# Patient Record
Sex: Male | Born: 2000 | Race: White | Hispanic: No | Marital: Single | Smoking: Never smoker
Health system: Southern US, Community
[De-identification: ages and names within clinical notes are randomized; demographics above are authoritative.]

## PROBLEM LIST (undated history)

## (undated) DIAGNOSIS — I10 Essential (primary) hypertension: Secondary | ICD-10-CM

## (undated) DIAGNOSIS — J45909 Unspecified asthma, uncomplicated: Secondary | ICD-10-CM

## (undated) HISTORY — PX: WISDOM TOOTH EXTRACTION: SHX21

## (undated) HISTORY — DX: Unspecified asthma, uncomplicated: J45.909

---
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
## 2018-04-13 ENCOUNTER — Other Ambulatory Visit: Payer: Self-pay | Admitting: Family

## 2018-04-13 DIAGNOSIS — H00011 Hordeolum externum right upper eyelid: Secondary | ICD-10-CM

## 2018-04-13 DIAGNOSIS — H00015 Hordeolum externum left lower eyelid: Secondary | ICD-10-CM

## 2019-08-30 IMAGING — DX DG KNEE 1-2V*R*
2 series · 2 of 2 positions shown · non-contrast
Comparison: None.

CLINICAL DATA: Injured right knee playing basketball. Unable to
straighten.

EXAM:
RIGHT KNEE - 1-2 VIEW

[knee ap]
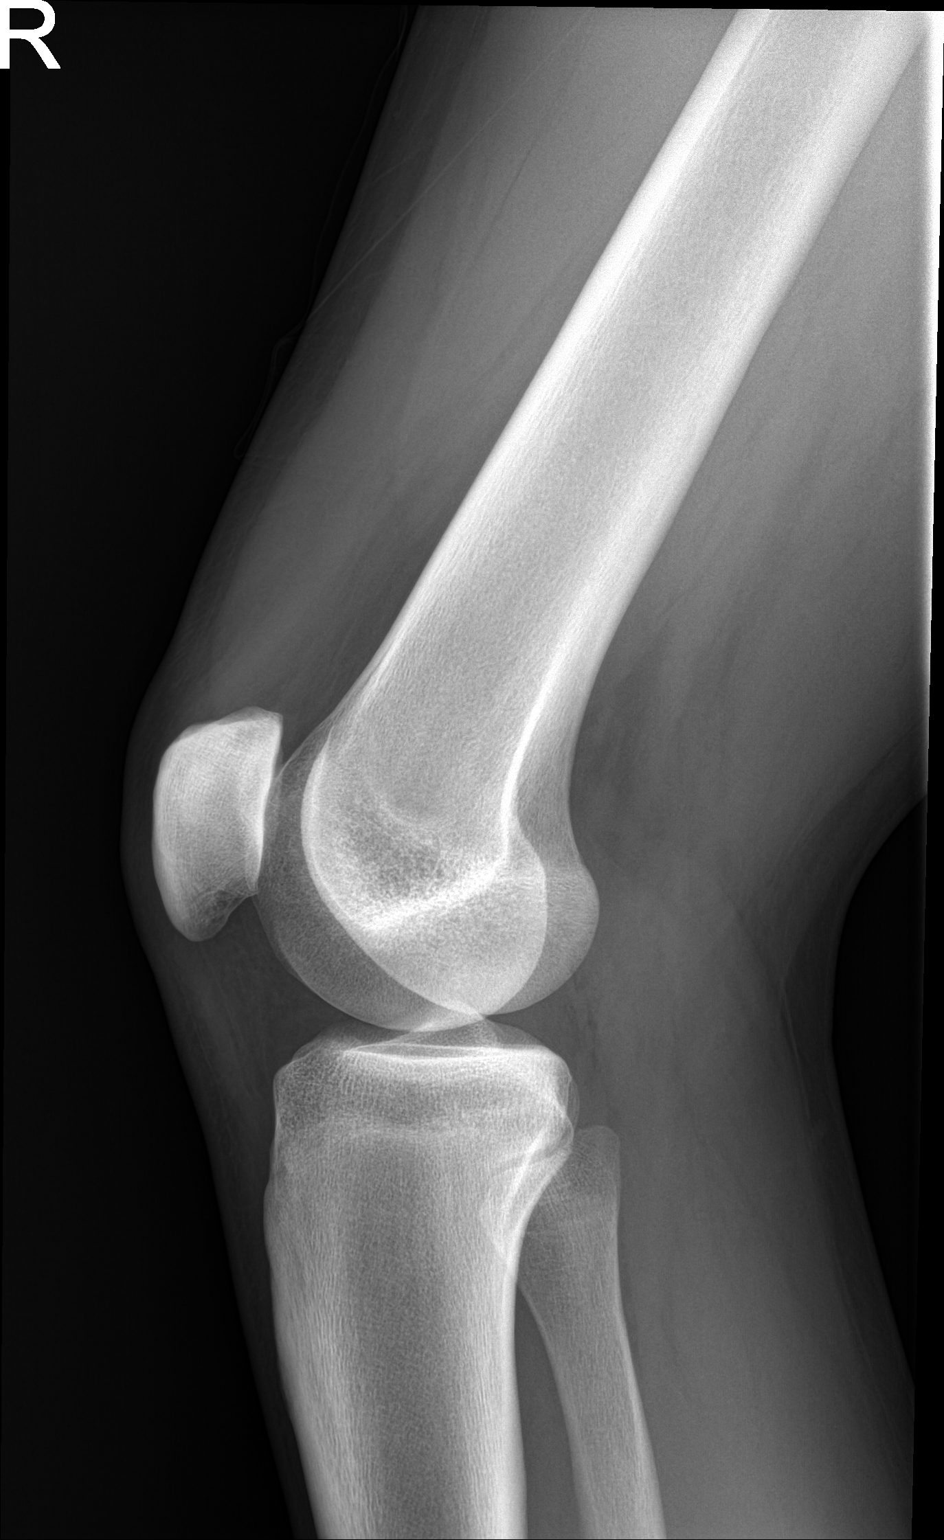

[knee lat]
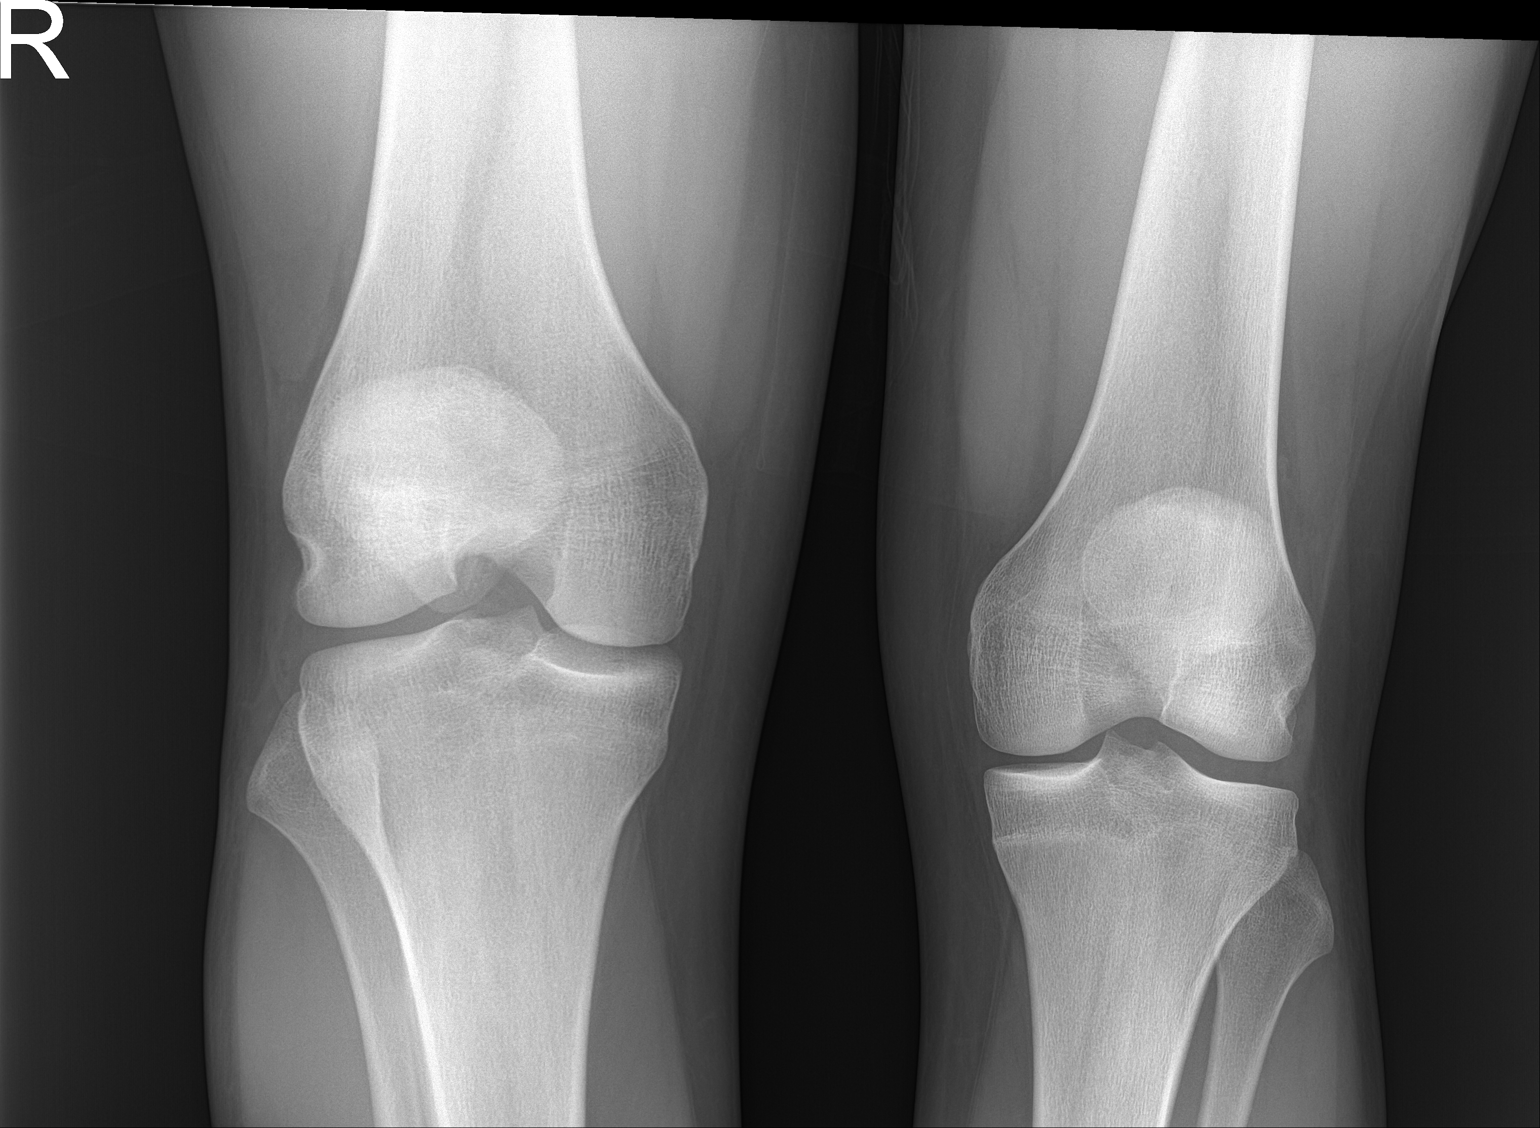

[2 of 2 positions shown; findings below may reference images not displayed]

FINDINGS: No evidence of fracture, dislocation, or joint effusion. No evidence
of arthropathy or other focal bone abnormality. Soft tissues are
unremarkable.
IMPRESSION: No acute osseous injury of the right knee.

## 2020-02-26 ENCOUNTER — Ambulatory Visit (INDEPENDENT_AMBULATORY_CARE_PROVIDER_SITE_OTHER): Payer: Medicaid Other | Admitting: Nurse Practitioner

## 2020-02-26 ENCOUNTER — Encounter: Payer: Self-pay | Admitting: Nurse Practitioner

## 2020-02-26 ENCOUNTER — Other Ambulatory Visit: Payer: Self-pay

## 2020-02-26 VITALS — BP 139/74 | HR 78 | Temp 98.1°F | Resp 20 | Ht 72.0 in | Wt 241.0 lb

## 2020-02-26 DIAGNOSIS — F419 Anxiety disorder, unspecified: Secondary | ICD-10-CM | POA: Insufficient documentation

## 2020-02-26 MED ORDER — BUPROPION HCL ER (SR) 100 MG PO TB12: 100.0000 mg | ORAL_TABLET | Freq: Two times a day (BID) | ORAL | 0 refills | Status: AC

## 2020-02-26 NOTE — Patient Instructions (Addendum)
Started you on BuSpar, initial dose 100 mg p.o. every 12 hours, may increase to 100 mg p.o. every 8 hours as early  day 4. Follow up in 4-5 weeks    Generalized Anxiety Disorder, Adult Generalized anxiety disorder (GAD) is a mental health disorder. People with this condition constantly worry about everyday events. Unlike normal anxiety, worry related to GAD is not triggered by a specific event. These worries also do not fade or get better with time. GAD interferes with life functions, including relationships, work, and school. GAD can vary from mild to severe. People with severe GAD can have intense waves of anxiety with physical symptoms (panic attacks). What are the causes? The exact cause of GAD is not known. What increases the risk? This condition is more likely to develop in:  Women.  People who have a family history of anxiety disorders.  People who are very shy.  People who experience very stressful life events, such as the death of a loved one.  People who have a very stressful family environment. What are the signs or symptoms? People with GAD often worry excessively about many things in their lives, such as their health and family. They may also be overly concerned about:  Doing well at work.  Being on time.  Natural disasters.  Friendships. Physical symptoms of GAD include:  Fatigue.  Muscle tension or having muscle twitches.  Trembling or feeling shaky.  Being easily startled.  Feeling like your heart is pounding or racing.  Feeling out of breath or like you cannot take a deep breath.  Having trouble falling asleep or staying asleep.  Sweating.  Nausea, diarrhea, or irritable bowel syndrome (IBS).  Headaches.  Trouble concentrating or remembering facts.  Restlessness.  Irritability. How is this diagnosed? Your health care provider can diagnose GAD based on your symptoms and medical history. You will also have a physical exam. The health care  provider will ask specific questions about your symptoms, including how severe they are, when they started, and if they come and go. Your health care provider may ask you about your use of alcohol or drugs, including prescription medicines. Your health care provider may refer you to a mental health specialist for further evaluation. Your health care provider will do a thorough examination and may perform additional tests to rule out other possible causes of your symptoms. To be diagnosed with GAD, a person must have anxiety that:  Is out of his or her control.  Affects several different aspects of his or her life, such as work and relationships.  Causes distress that makes him or her unable to take part in normal activities.  Includes at least three physical symptoms of GAD, such as restlessness, fatigue, trouble concentrating, irritability, muscle tension, or sleep problems. Before your health care provider can confirm a diagnosis of GAD, these symptoms must be present more days than they are not, and they must last for six months or longer. How is this treated? The following therapies are usually used to treat GAD:  Medicine. Antidepressant medicine is usually prescribed for long-term daily control. Antianxiety medicines may be added in severe cases, especially when panic attacks occur.  Talk therapy (psychotherapy). Certain types of talk therapy can be helpful in treating GAD by providing support, education, and guidance. Options include: ? Cognitive behavioral therapy (CBT). People learn coping skills and techniques to ease their anxiety. They learn to identify unrealistic or negative thoughts and behaviors and to replace them with positive ones. ?  Acceptance and commitment therapy (ACT). This treatment teaches people how to be mindful as a way to cope with unwanted thoughts and feelings. ? Biofeedback. This process trains you to manage your body's response (physiological response) through  breathing techniques and relaxation methods. You will work with a therapist while machines are used to monitor your physical symptoms.  Stress management techniques. These include yoga, meditation, and exercise. A mental health specialist can help determine which treatment is best for you. Some people see improvement with one type of therapy. However, other people require a combination of therapies. Follow these instructions at home:  Take over-the-counter and prescription medicines only as told by your health care provider.  Try to maintain a normal routine.  Try to anticipate stressful situations and allow extra time to manage them.  Practice any stress management or self-calming techniques as taught by your health care provider.  Do not punish yourself for setbacks or for not making progress.  Try to recognize your accomplishments, even if they are small.  Keep all follow-up visits as told by your health care provider. This is important. Contact a health care provider if:  Your symptoms do not get better.  Your symptoms get worse.  You have signs of depression, such as: ? A persistently sad, cranky, or irritable mood. ? Loss of enjoyment in activities that used to bring you joy. ? Change in weight or eating. ? Changes in sleeping habits. ? Avoiding friends or family members. ? Loss of energy for normal tasks. ? Feelings of guilt or worthlessness. Get help right away if:  You have serious thoughts about hurting yourself or others. If you ever feel like you may hurt yourself or others, or have thoughts about taking your own life, get help right away. You can go to your nearest emergency department or call:  Your local emergency services (911 in the [place]  A suicide crisis helpline, such as the National Suicide Prevention Lifeline at 214 229 7687. This is open 24 hours a day. Summary  Generalized anxiety disorder (GAD) is a mental health disorder that involves worry that  is not triggered by a specific event.  People with GAD often worry excessively about many things in their lives, such as their health and family.  GAD may cause physical symptoms such as restlessness, trouble concentrating, sleep problems, frequent sweating, nausea, diarrhea, headaches, and trembling or muscle twitching.  A mental health specialist can help determine which treatment is best for you. Some people see improvement with one type of therapy. However, other people require a combination of therapies. This information is not intended to replace advice given to you by your health care provider. Make sure you discuss any questions you have with your health care provider. Document Revised: 04/08/2017 Document Reviewed: 03/16/2016 Elsevier Patient Education  2020 [employer].

## 2020-02-26 NOTE — Assessment & Plan Note (Signed)
Patient is a 19 year old male who presents to clinic for follow-up anxiety.  Patient reports having anxiety in middle school but cannot remember what medication he used.  Patient is now reporting gradual worsening anxiety with increase worrying, anxious most of the time, worrying about different things, trouble relaxing, being restless and hard to stand still, becoming easily annoyed and irritable and feeling afraid as if something awful might happen.  Patient also reports feeling tired all the time and also forgetting to eat.  Patient has a family history father has anxiety and bipolar and mom a history of depression.  Patient denies suicidal ideation, thoughts of harming self and others.  Provided education to patient with printed handouts given. Started patient on Wellbutrin SR 100 mg twice daily and can increase to every 8 hours on the fourth day. Follow-up in 4 to 5 weeks. Rx sent to pharmacy.  Advised patient to call or follow-up with worsening or unresolved symptoms.

## 2020-02-26 NOTE — Progress Notes (Signed)
Established Patient Office Visit  Subjective:  Patient ID: Seth Ritter, male    DOB: [birth year]  Age: 19 y.o. MRN: [id number]  CC:  Chief Complaint  Patient presents with  . Anxiety    HPI Seth Ritter presents for Anxiety: Patient complains of anxiety disorder.  He has the following symptoms: difficulty concentrating, irritable. Onset of symptoms was approximately 4 years ago, gradually worsening since that time. He denies current suicidal and homicidal ideation. Family history significant for anxiety and depression.Possible organic causes contributing are: none. Risk factors: positive family history in  mother Previous treatment includes Patient cannot remember and .  He complains of the following side effects from the treatment: none.   Past Medical History:  Diagnosis Date  . Asthma   . Hypertension    "pre-hypertension" at age 48    Past Surgical History:  Procedure Laterality Date  . KNEE ARTHROSCOPY WITH MENISCAL REPAIR Right 07/26/2017   Procedure: RIGHT KNEE ARTHROSCOPY WITH MENISCAL REPAIR;  Surgeon: Yolonda Kida, MD;  Location: [facility] OR;  Service: Orthopedics;  Laterality: Right;  . WISDOM TOOTH EXTRACTION      Family History  Problem Relation Age of Onset  . Bipolar disorder Father       Outpatient Medications Prior to Visit  Medication Sig Dispense Refill  . ibuprofen (ADVIL,MOTRIN) 200 MG tablet Take 800 mg by mouth every 6 (six) hours as needed for headache or moderate pain.    Marland Kitchen albuterol (PROVENTIL HFA;VENTOLIN HFA) 108 (90 Base) MCG/ACT inhaler Inhale 2 puffs into the lungs every 6 (six) hours as needed for wheezing. 1 Inhaler 2  . ondansetron (ZOFRAN ODT) 4 MG disintegrating tablet Take 1 tablet (4 mg total) by mouth every 8 (eight) hours as needed. 20 tablet 0  . oxyCODONE (OXY IR/ROXICODONE) 5 MG immediate release tablet Take 1-2 tablets (5-10 mg total) by mouth every 4 (four) hours as needed for moderate pain or severe pain. 40 tablet 0   No  facility-administered medications prior to visit.    Allergies  Allergen Reactions  . Sulfa Antibiotics Hives    ROS Review of Systems  Psychiatric/Behavioral: Negative for self-injury, sleep disturbance and suicidal ideas. The patient is nervous/anxious. The patient is not hyperactive.   All other systems reviewed and are negative.     Objective:    Physical Exam Vitals reviewed.  Constitutional:      Appearance: Normal appearance. He is normal weight.  HENT:     Head: Normocephalic.     Mouth/Throat:     Mouth: Mucous membranes are moist.     Pharynx: Oropharynx is clear.  Eyes:     Conjunctiva/sclera: Conjunctivae normal.  Cardiovascular:     Rate and Rhythm: Normal rate and regular rhythm.     Pulses: Normal pulses.     Heart sounds: Normal heart sounds.  Pulmonary:     Effort: Pulmonary effort is normal.     Breath sounds: Normal breath sounds.  Abdominal:     General: Bowel sounds are normal.  Musculoskeletal:        General: Normal range of motion.  Neurological:     Mental Status: He is alert and oriented to person, place, and time.  Psychiatric:     Comments: Anxiety     Resp 20   Ht 6' (1.829 m)   Wt 241 lb (109.3 kg)   BMI 32.69 kg/m  Wt Readings from Last 3 Encounters:  02/26/20 241 lb (109.3 kg)  07/25/17 212  lb 1.3 oz (96.2 kg) (96 %, Z= 1.80)*  07/05/17 212 lb (96.2 kg) (96 %, Z= 1.80)*   * Growth percentiles are based on CDC (Boys, 2-20 Years) data.     Health Maintenance Due  Topic Date Due  . Hepatitis C Screening  Never done  . COVID-19 Vaccine (1) Never done  . HIV Screening  Never done  . INFLUENZA VACCINE  12/09/2019     No results found for: TSH Lab Results  Component Value Date   WBC 14.2 (H) 07/26/2017   HGB 12.9 (L) 07/26/2017   HCT 41.7 07/26/2017   MCV 77.9 (L) 07/26/2017   PLT 342 07/26/2017      Assessment & Plan:  Anxiety Patient is a 19 year old male who presents to clinic for follow-up anxiety.  Patient  reports having anxiety in middle school but cannot remember what medication he used.  Patient is now reporting gradual worsening anxiety with increase worrying, anxious most of the time, worrying about different things, trouble relaxing, being restless and hard to stand still, becoming easily annoyed and irritable and feeling afraid as if something awful might happen.  Patient also reports feeling tired all the time and also forgetting to eat.  Patient has a family history father has anxiety and bipolar and mom a history of depression.  Patient denies suicidal ideation, thoughts of harming self and others.  Provided education to patient with printed handouts given. Started patient on Wellbutrin SR 100 mg twice daily and can increase to every 8 hours on the fourth day. Follow-up in 4 to 5 weeks. Rx sent to pharmacy.  Advised patient to call or follow-up with worsening or unresolved symptoms.  Problem List Items Addressed This Visit      Other   Anxiety - Primary   Relevant Medications   buPROPion (WELLBUTRIN SR) 100 MG 12 hr tablet      Meds ordered this encounter  Medications  . buPROPion (WELLBUTRIN SR) 100 MG 12 hr tablet    Sig: Take 1 tablet (100 mg total) by mouth 2 (two) times daily.    Dispense:  90 tablet    Refill:  0    Order Specific Question:   Supervising Provider    Answer:   Arville Care A [[id number]]    Follow-up: Return in about 4 weeks (around 03/25/2020).    Daryll Drown, NP

## 2020-03-25 ENCOUNTER — Ambulatory Visit (INDEPENDENT_AMBULATORY_CARE_PROVIDER_SITE_OTHER): Payer: Self-pay | Admitting: Nurse Practitioner

## 2020-03-25 ENCOUNTER — Encounter: Payer: Self-pay | Admitting: Nurse Practitioner

## 2020-03-25 ENCOUNTER — Other Ambulatory Visit: Payer: Self-pay

## 2020-03-25 VITALS — BP 135/63 | HR 60 | Temp 97.5°F | Resp 20 | Ht 72.0 in | Wt 237.0 lb

## 2020-03-25 DIAGNOSIS — F419 Anxiety disorder, unspecified: Secondary | ICD-10-CM

## 2020-03-25 NOTE — Assessment & Plan Note (Addendum)
Patient's anxiety well controlled on current medication Wellbutrin 100 mg tablet by mouth twice daily. I continue to provide education to patient with printed handouts given. PHQ-9, and  GAD-7 completed, Follow-up in 3 months.

## 2020-03-25 NOTE — Patient Instructions (Addendum)
Anxiety well controlled on current medication no changes to dose.  Please follow-up in 3 months or with worsening or unresolved symptoms.  Generalized Anxiety Disorder, Adult Generalized anxiety disorder (GAD) is a mental health disorder. People with this condition constantly worry about everyday events. Unlike normal anxiety, worry related to GAD is not triggered by a specific event. These worries also do not fade or get better with time. GAD interferes with life functions, including relationships, work, and school. GAD can vary from mild to severe. People with severe GAD can have intense waves of anxiety with physical symptoms (panic attacks). What are the causes? The exact cause of GAD is not known. What increases the risk? This condition is more likely to develop in:  Women.  People who have a family history of anxiety disorders.  People who are very shy.  People who experience very stressful life events, such as the death of a loved one.  People who have a very stressful family environment. What are the signs or symptoms? People with GAD often worry excessively about many things in their lives, such as their health and family. They may also be overly concerned about:  Doing well at work.  Being on time.  Natural disasters.  Friendships. Physical symptoms of GAD include:  Fatigue.  Muscle tension or having muscle twitches.  Trembling or feeling shaky.  Being easily startled.  Feeling like your heart is pounding or racing.  Feeling out of breath or like you cannot take a deep breath.  Having trouble falling asleep or staying asleep.  Sweating.  Nausea, diarrhea, or irritable bowel syndrome (IBS).  Headaches.  Trouble concentrating or remembering facts.  Restlessness.  Irritability. How is this diagnosed? Your health care provider can diagnose GAD based on your symptoms and medical history. You will also have a physical exam. The health care provider will  ask specific questions about your symptoms, including how severe they are, when they started, and if they come and go. Your health care provider may ask you about your use of alcohol or drugs, including prescription medicines. Your health care provider may refer you to a mental health specialist for further evaluation. Your health care provider will do a thorough examination and may perform additional tests to rule out other possible causes of your symptoms. To be diagnosed with GAD, a person must have anxiety that:  Is out of his or her control.  Affects several different aspects of his or her life, such as work and relationships.  Causes distress that makes him or her unable to take part in normal activities.  Includes at least three physical symptoms of GAD, such as restlessness, fatigue, trouble concentrating, irritability, muscle tension, or sleep problems. Before your health care provider can confirm a diagnosis of GAD, these symptoms must be present more days than they are not, and they must last for six months or longer. How is this treated? The following therapies are usually used to treat GAD:  Medicine. Antidepressant medicine is usually prescribed for long-term daily control. Antianxiety medicines may be added in severe cases, especially when panic attacks occur.  Talk therapy (psychotherapy). Certain types of talk therapy can be helpful in treating GAD by providing support, education, and guidance. Options include: ? Cognitive behavioral therapy (CBT). People learn coping skills and techniques to ease their anxiety. They learn to identify unrealistic or negative thoughts and behaviors and to replace them with positive ones. ? Acceptance and commitment therapy (ACT). This treatment teaches people how to  be mindful as a way to cope with unwanted thoughts and feelings. ? Biofeedback. This process trains you to manage your body's response (physiological response) through breathing  techniques and relaxation methods. You will work with a therapist while machines are used to monitor your physical symptoms.  Stress management techniques. These include yoga, meditation, and exercise. A mental health specialist can help determine which treatment is best for you. Some people see improvement with one type of therapy. However, other people require a combination of therapies. Follow these instructions at home:  Take over-the-counter and prescription medicines only as told by your health care provider.  Try to maintain a normal routine.  Try to anticipate stressful situations and allow extra time to manage them.  Practice any stress management or self-calming techniques as taught by your health care provider.  Do not punish yourself for setbacks or for not making progress.  Try to recognize your accomplishments, even if they are small.  Keep all follow-up visits as told by your health care provider. This is important. Contact a health care provider if:  Your symptoms do not get better.  Your symptoms get worse.  You have signs of depression, such as: ? A persistently sad, cranky, or irritable mood. ? Loss of enjoyment in activities that used to bring you joy. ? Change in weight or eating. ? Changes in sleeping habits. ? Avoiding friends or family members. ? Loss of energy for normal tasks. ? Feelings of guilt or worthlessness. Get help right away if:  You have serious thoughts about hurting yourself or others. If you ever feel like you may hurt yourself or others, or have thoughts about taking your own life, get help right away. You can go to your nearest emergency department or call:  Your local emergency services (911 in the [place]  A suicide crisis helpline, such as the National Suicide Prevention Lifeline at 431 670 1734. This is open 24 hours a day. Summary  Generalized anxiety disorder (GAD) is a mental health disorder that involves worry that is not  triggered by a specific event.  People with GAD often worry excessively about many things in their lives, such as their health and family.  GAD may cause physical symptoms such as restlessness, trouble concentrating, sleep problems, frequent sweating, nausea, diarrhea, headaches, and trembling or muscle twitching.  A mental health specialist can help determine which treatment is best for you. Some people see improvement with one type of therapy. However, other people require a combination of therapies. This information is not intended to replace advice given to you by your health care provider. Make sure you discuss any questions you have with your health care provider. Document Revised: 04/08/2017 Document Reviewed: 03/16/2016 Elsevier Patient Education  2020 [employer].

## 2020-03-25 NOTE — Progress Notes (Signed)
Established Patient Office Visit  Subjective:  Patient ID: Seth Ritter, male    DOB: Mar 22, 2001  Age: 19 y.o. MRN: [id number]  CC:  Chief Complaint  Patient presents with  . Anxiety    4 week     HPI Seth Ritter presents for Anxiety: Patient complains of anxiety disorder.  He has the following symptoms: difficulty concentrating, irritable. Onset of symptoms was approximately 1 month ago, gradually improving since that time. He denies current suicidal and homicidal ideation. Family history significant for Bipolar disorder.Possible organic causes contributing are: none. Risk factors: positive family history in  father Previous treatment includes Wellbutrin. He complains of the following side effects from the treatment: none.   Past Medical History:  Diagnosis Date  . Asthma   . Hypertension    "pre-hypertension" at age 27    Past Surgical History:  Procedure Laterality Date  . KNEE ARTHROSCOPY WITH MENISCAL REPAIR Right 07/26/2017   Procedure: RIGHT KNEE ARTHROSCOPY WITH MENISCAL REPAIR;  Surgeon: Yolonda Kida, MD;  Location: [facility] OR;  Service: Orthopedics;  Laterality: Right;  . WISDOM TOOTH EXTRACTION      Family History  Problem Relation Age of Onset  . Bipolar disorder Father     Social History   Socioeconomic History  . Marital status: Single    Spouse name: Not on file  . Number of children: Not on file  . Years of education: Not on file  . Highest education level: Not on file  Occupational History  . Not on file  Tobacco Use  . Smoking status: Never Smoker  . Smokeless tobacco: Never Used  Vaping Use  . Vaping Use: Never used  Substance and Sexual Activity  . Alcohol use: No  . Drug use: No  . Sexual activity: Not on file  Other Topics Concern  . Not on file  Social History Narrative  . Not on file   Social Determinants of Health   Financial Resource Strain:   . Difficulty of Paying Living Expenses: Not on file  Food Insecurity:   .  Worried About Programme researcher, broadcasting/film/video in the Last Year: Not on file  . Ran Out of Food in the Last Year: Not on file  Transportation Needs:   . Lack of Transportation (Medical): Not on file  . Lack of Transportation (Non-Medical): Not on file  Physical Activity:   . Days of Exercise per Week: Not on file  . Minutes of Exercise per Session: Not on file  Stress:   . Feeling of Stress : Not on file  Social Connections:   . Frequency of Communication with Friends and Family: Not on file  . Frequency of Social Gatherings with Friends and Family: Not on file  . Attends Religious Services: Not on file  . Active Member of Clubs or Organizations: Not on file  . Attends Banker Meetings: Not on file  . Marital Status: Not on file  Intimate Partner Violence:   . Fear of Current or Ex-Partner: Not on file  . Emotionally Abused: Not on file  . Physically Abused: Not on file  . Sexually Abused: Not on file    Outpatient Medications Prior to Visit  Medication Sig Dispense Refill  . buPROPion (WELLBUTRIN SR) 100 MG 12 hr tablet Take 1 tablet (100 mg total) by mouth 2 (two) times daily. 90 tablet 0  . ibuprofen (ADVIL,MOTRIN) 200 MG tablet Take 800 mg by mouth every 6 (six) hours as needed for  headache or moderate pain.     No facility-administered medications prior to visit.    Allergies  Allergen Reactions  . Sulfa Antibiotics Hives    ROS Review of Systems  Psychiatric/Behavioral: Negative for agitation, confusion, self-injury and suicidal ideas. The patient is nervous/anxious.   All other systems reviewed and are negative.     Objective:    Physical Exam Vitals reviewed.  Constitutional:      Appearance: Normal appearance.  HENT:     Head: Normocephalic.  Eyes:     Conjunctiva/sclera: Conjunctivae normal.  Cardiovascular:     Pulses: Normal pulses.     Heart sounds: Normal heart sounds.  Pulmonary:     Effort: Pulmonary effort is normal.     Breath sounds:  Normal breath sounds.  Abdominal:     General: Bowel sounds are normal.  Musculoskeletal:        General: Normal range of motion.  Skin:    General: Skin is warm.  Neurological:     Mental Status: He is alert and oriented to person, place, and time.  Psychiatric:     Comments: Anxiety     BP 135/63   Pulse 60   Temp (!) 97.5 F (36.4 C)   Resp 20   Ht 6' (1.829 m)   Wt 237 lb (107.5 kg)   SpO2 98%   BMI 32.14 kg/m  Wt Readings from Last 3 Encounters:  03/25/20 237 lb (107.5 kg)  02/26/20 241 lb (109.3 kg)  07/25/17 212 lb 1.3 oz (96.2 kg) (96 %, Z= 1.80)*   * Growth percentiles are based on CDC (Boys, 2-20 Years) data.     Health Maintenance Due  Topic Date Due  . Hepatitis C Screening  Never done  . COVID-19 Vaccine (1) Never done  . HIV Screening  Never done  . INFLUENZA VACCINE  12/09/2019    There are no preventive care reminders to display for this patient.  No results found for: TSH Lab Results  Component Value Date   WBC 14.2 (H) 07/26/2017   HGB 12.9 (L) 07/26/2017   HCT 41.7 07/26/2017   MCV 77.9 (L) 07/26/2017   PLT 342 07/26/2017      Assessment & Plan:     Office Visit from 03/25/2020 in [facility]  PHQ-9 Total Score 1     GAD 7 : Generalized Anxiety Score 03/25/2020 02/26/2020  Nervous, Anxious, on Edge 1 3  Control/stop worrying 0 2  Worry too much - different things 0 2  Trouble relaxing 0 1  Restless 0 1  Easily annoyed or irritable 0 2  Afraid - awful might happen 0 0  Total GAD 7 Score 1 11  Anxiety Difficulty Not difficult at all Somewhat difficult    Problem List Items Addressed This Visit      Other   Anxiety - Primary    Patient's anxiety well controlled on current medication Wellbutrin 100 mg tablet by mouth twice daily. I continue to provide education to patient with printed handouts given. Follow-up in 3 months.            Follow-up: Return in about 3 months (around 06/25/2020).     Daryll Drown, NP

## 2020-06-27 ENCOUNTER — Ambulatory Visit: Payer: Self-pay | Admitting: Nurse Practitioner

## 2020-06-30 ENCOUNTER — Encounter: Payer: Self-pay | Admitting: Nurse Practitioner

## 2022-10-26 ENCOUNTER — Encounter: Payer: Self-pay | Admitting: Nurse Practitioner

## 2022-10-26 ENCOUNTER — Ambulatory Visit
Admission: EM | Admit: 2022-10-26 | Discharge: 2022-10-26 | Disposition: A | Discharge status: Home / Self Care | Payer: 59 | Attending: Nurse Practitioner | Admitting: Nurse Practitioner

## 2022-10-26 DIAGNOSIS — L03115 Cellulitis of right lower limb: Secondary | ICD-10-CM | POA: Diagnosis not present

## 2022-10-26 MED ORDER — CEPHALEXIN 500 MG PO CAPS: 500.0000 mg | ORAL_CAPSULE | Freq: Three times a day (TID) | ORAL | 0 refills | Status: AC

## 2022-10-26 NOTE — ED Triage Notes (Addendum)
Pt c/o insect bite pt states he thinks it happened on Sunday evening on, he scratched it that night pt states Monday he noticed some swelling, redness, and warmth to the touch yesterday.  Right inner ankle

## 2022-10-26 NOTE — Discharge Instructions (Addendum)
Take medication as prescribed. May take over-the-counter Tylenol or ibuprofen as needed for pain, fever, or general discomfort. Cool compresses to the right ankle to help with pain and swelling. Elevating the right leg above the level of the heart will also help with swelling. Monitor the area for worsening symptoms to include increased redness, swelling, or foul-smelling drainage.  If you notice any of the symptoms, please follow-up immediately for further evaluation. Follow-up as needed.

## 2022-10-26 NOTE — ED Provider Notes (Signed)
[facility]    CSN: [id number] Arrival date & time: 10/26/22  1552      History   Chief Complaint No chief complaint on file.   HPI Seth Ritter is a 22 y.o. male.   The history is provided by the patient.   The patient presents for complaints of pain, swelling, and warmth to the right ankle after an insect bite approximately 2 days ago.  Patient patient states that the area was itchy.  He states over the past 24 hours, he has noticed increased redness and swelling, along with pain in the right lower leg with weightbearing.  Patient denies fever, chills, chest pain, abdominal pain, nausea, vomiting, diarrhea, foul-smelling drainage, numbness, tingling, or inability to bear weight.  Patient reports he took Benadryl and Advil for his symptoms.  Past Medical History:  Diagnosis Date   Asthma    Hypertension    "pre-hypertension" at age 8    Patient Active Problem List   Diagnosis Date Noted   Anxiety 02/26/2020   Bucket handle tear of medial meniscus of right knee, initial encounter 07/26/2017   Elevated blood pressure 03/10/2015   Asthma, chronic 08/08/2014   Knee pain 08/15/2012    Past Surgical History:  Procedure Laterality Date   KNEE ARTHROSCOPY WITH MENISCAL REPAIR Right 07/26/2017   Procedure: RIGHT KNEE ARTHROSCOPY WITH MENISCAL REPAIR;  Surgeon: Yolonda Kida, MD;  Location: [facility] OR;  Service: Orthopedics;  Laterality: Right;   WISDOM TOOTH EXTRACTION         Home Medications    Prior to Admission medications   Medication Sig Start Date End Date Taking? Authorizing Provider  cephALEXin (KEFLEX) 500 MG capsule Take 1 capsule (500 mg total) by mouth 3 (three) times daily for 5 days. 10/26/22 10/31/22 Yes Shay Bartoli-Warren, Sadie Haber, NP  buPROPion (WELLBUTRIN SR) 100 MG 12 hr tablet Take 1 tablet (100 mg total) by mouth 2 (two) times daily. 02/26/20   Daryll Drown, NP  ibuprofen (ADVIL,MOTRIN) 200 MG tablet Take 800 mg by mouth every 6 (six)  hours as needed for headache or moderate pain.    [provider]    Family History Family History  Problem Relation Age of Onset   Bipolar disorder Father     Social History Social History   Tobacco Use   Smoking status: Never   Smokeless tobacco: Never  Vaping Use   Vaping Use: Never used  Substance Use Topics   Alcohol use: No   Drug use: No     Allergies   Sulfa antibiotics   Review of Systems Review of Systems Per HPI  Physical Exam Triage Vital Signs ED Triage Vitals  Enc Vitals Group     BP 10/26/22 1610 (!) 141/83     Pulse Rate 10/26/22 1610 82     Resp 10/26/22 1610 14     Temp 10/26/22 1610 98.3 F (36.8 C)     Temp Source 10/26/22 1610 Oral     SpO2 10/26/22 1610 99 %     Weight --      Height --      Head Circumference --      Peak Flow --      Pain Score 10/26/22 1615 6     Pain Loc --      Pain Edu? --      Excl. in GC? --    No data found.  Updated Vital Signs BP (!) 141/83 (BP Location: Right Arm)  Pulse 82   Temp 98.3 F (36.8 C) (Oral)   Resp 14   SpO2 99%   Visual Acuity Right Eye Distance:   Left Eye Distance:   Bilateral Distance:    Right Eye Near:   Left Eye Near:    Bilateral Near:     Physical Exam Vitals and nursing note reviewed.  Constitutional:      General: He is not in acute distress.    Appearance: Normal appearance.  HENT:     Head: Normocephalic.  Eyes:     Extraocular Movements: Extraocular movements intact.     Pupils: Pupils are equal, round, and reactive to light.  Cardiovascular:     Rate and Rhythm: Normal rate and regular rhythm.     Pulses: Normal pulses.     Heart sounds: Normal heart sounds.  Pulmonary:     Effort: Pulmonary effort is normal. No respiratory distress.     Breath sounds: Normal breath sounds. No stridor. No wheezing, rhonchi or rales.  Musculoskeletal:     Cervical back: Normal range of motion.  Skin:    General: Skin is warm and dry.       Neurological:      General: No focal deficit present.     Mental Status: He is alert and oriented to person, place, and time.  Psychiatric:        Mood and Affect: Mood normal.        Behavior: Behavior normal.      UC Treatments / Results  Labs (all labs ordered are listed, but only abnormal results are displayed) Labs Reviewed - No data to display  EKG   Radiology No results found.  Procedures Procedures (including critical care time)  Medications Ordered in UC Medications - No data to display  Initial Impression / Assessment and Plan / UC Course  I have reviewed the triage vital signs and the nursing notes.  Pertinent labs & imaging results that were available during my care of the patient were reviewed by me and considered in my medical decision making (see chart for details).  Patient is well-appearing, he is in no acute distress, vital signs are stable.  Patient with redness, swelling, pain, and warmth to the medial aspect of the right ankle after an insect bite.  Symptoms appear to be consistent with cellulitis.  Will treat with Keflex 500 mg 3 times daily for the next 5 days.  Supportive care recommendations were provided and discussed with the patient to include over-the-counter analgesics for pain or discomfort, cool compresses to help with pain and swelling, and elevation of the right lower extremity.  Patient was given indications of when follow-up may be necessary.  Patient was in agreement with this plan of care and verbalizes understanding.  All questions were answered.  Patient stable for discharge.   Final Clinical Impressions(s) / UC Diagnoses   Final diagnoses:  Cellulitis of right ankle     Discharge Instructions      Take medication as prescribed. May take over-the-counter Tylenol or ibuprofen as needed for pain, fever, or general discomfort. Cool compresses to the right ankle to help with pain and swelling. Elevating the right leg above the level of the heart  will also help with swelling. Monitor the area for worsening symptoms to include increased redness, swelling, or foul-smelling drainage.  If you notice any of the symptoms, please follow-up immediately for further evaluation. Follow-up as needed.     ED Prescriptions  Medication Sig Dispense Auth. Provider   cephALEXin (KEFLEX) 500 MG capsule Take 1 capsule (500 mg total) by mouth 3 (three) times daily for 5 days. 15 capsule Beverlie Kurihara-Warren, Sadie Haber, NP      PDMP not reviewed this encounter.   Abran Cantor, NP 10/26/22 1636

## 2023-07-13 ENCOUNTER — Encounter (HOSPITAL_COMMUNITY): Payer: Self-pay | Admitting: *Deleted

## 2023-07-13 ENCOUNTER — Other Ambulatory Visit: Payer: Self-pay

## 2023-07-13 ENCOUNTER — Emergency Department (HOSPITAL_COMMUNITY): Admission: EM | Admit: 2023-07-13 | Discharge: 2023-07-13 | Disposition: A | Discharge status: Home / Self Care

## 2023-07-13 DIAGNOSIS — S61412A Laceration without foreign body of left hand, initial encounter: Secondary | ICD-10-CM | POA: Insufficient documentation

## 2023-07-13 DIAGNOSIS — W268XXA Contact with other sharp object(s), not elsewhere classified, initial encounter: Secondary | ICD-10-CM | POA: Insufficient documentation

## 2023-07-13 DIAGNOSIS — Z23 Encounter for immunization: Secondary | ICD-10-CM | POA: Diagnosis not present

## 2023-07-13 MED ORDER — LIDOCAINE HCL (PF) 2 % IJ SOLN
5.0000 mL | Freq: Once | INTRAMUSCULAR | Status: AC
  Administered 2023-07-13: 5 mL via INTRADERMAL

## 2023-07-13 MED ORDER — TETANUS-DIPHTH-ACELL PERTUSSIS 5-2.5-18.5 LF-MCG/0.5 IM SUSY
0.5000 mL | PREFILLED_SYRINGE | Freq: Once | INTRAMUSCULAR | Status: AC
  Administered 2023-07-13: 0.5 mL via INTRAMUSCULAR
  Filled 2023-07-13: qty 0.5

## 2023-07-13 MED ORDER — POVIDONE-IODINE 10 % EX SOLN
CUTANEOUS | Status: DC | PRN
  Filled 2023-07-13: qty 14.8

## 2023-07-13 NOTE — Discharge Instructions (Signed)
 Keep wound clean with mild soap and water.  Keep it bandaged.  Avoid excessive use of your left hand for the next 4 to 5 days.  Sutures out in 8 to 10 days.  Return to the emergency department for any new or worsening symptoms or signs of infection.  You may take Tylenol or ibuprofen if needed for discomfort.

## 2023-07-13 NOTE — ED Provider Notes (Signed)
 [facility] AT [facility] Provider Note   CSN: [id number] Arrival date & time: 07/13/23  1453     History  Chief Complaint  Patient presents with   Laceration    Seth Ritter is a 23 y.o. male.   Laceration Associated symptoms: no fever         Seth Ritter is a 23 y.o. male who presents to the Emergency Department complaining of laceration of the left hand.  He was using a razor blade to cut something when he accidentally cut the palm of his hand.  Bleeding has been controlled using direct pressure.  He does not take any blood thinners.  He denies any numbness of his hand or fingers no weakness of his hand fingers or wrist.  Last Td is unknown.  Home Medications Prior to Admission medications   Medication Sig Start Date End Date Taking? Authorizing Provider  buPROPion (WELLBUTRIN SR) 100 MG 12 hr tablet Take 1 tablet (100 mg total) by mouth 2 (two) times daily. 02/26/20   Daryll Drown, NP  ibuprofen (ADVIL,MOTRIN) 200 MG tablet Take 800 mg by mouth every 6 (six) hours as needed for headache or moderate pain.    [provider]      Allergies    Sulfa antibiotics    Review of Systems   Review of Systems  Constitutional:  Negative for appetite change, chills and fever.  Respiratory:  Negative for cough and shortness of breath.   Cardiovascular:  Negative for chest pain.  Gastrointestinal:  Negative for abdominal pain, nausea and vomiting.  Musculoskeletal:  Negative for arthralgias and back pain.  Skin:  Positive for wound. Negative for color change.  Neurological:  Negative for dizziness, weakness and numbness.    Physical Exam Updated Vital Signs BP 133/69 (BP Location: Right Arm)   Pulse 75   Temp 98 F (36.7 C) (Oral)   Resp 17   Ht 6\' 2"  (1.88 m)   Wt 111.1 kg   SpO2 98%   BMI 31.46 kg/m  Physical Exam Vitals and nursing note reviewed.  Constitutional:      General: He is not in acute distress.     Appearance: Normal appearance. He is not ill-appearing or toxic-appearing.  Cardiovascular:     Rate and Rhythm: Normal rate and regular rhythm.     Pulses: Normal pulses.  Pulmonary:     Effort: Pulmonary effort is normal.  Musculoskeletal:        General: Normal range of motion.     Comments: 2 cm laceration to the palm of the left hand.  Bleeding controlled.  Part of laceration is very superficial.  No edema patient has full range of motion of the wrist and fingers of the left hand.  Normal finger thumb opposition.  Skin:    General: Skin is warm.     Capillary Refill: Capillary refill takes less than 2 seconds.  Neurological:     General: No focal deficit present.     Mental Status: He is alert.     Sensory: No sensory deficit.     Motor: No weakness.     ED Results / Procedures / Treatments   Labs (all labs ordered are listed, but only abnormal results are displayed) Labs Reviewed - No data to display  EKG None  Radiology No results found.  Procedures Procedures     LACERATION REPAIR Performed by: Andora Krull Authorized by: Maylon Sailors Consent: Verbal consent obtained.  Risks and benefits: risks, benefits and alternatives were discussed Consent given by: patient Patient identity confirmed: provided demographic data Prepped and Draped in normal sterile fashion Wound explored  Laceration Location: left hand  Laceration Length: 2 cm  No Foreign Bodies seen or palpated  Anesthesia: local infiltration  Local anesthetic: lidocaine 2% w/o epinephrine  Anesthetic total: 2 ml  Irrigation method: syringe Amount of cleaning: standard  Skin closure: 4-0 prolene  Number of sutures: 3  Technique: simple interrupted  Patient tolerance: Patient tolerated the procedure well with no immediate complications.  Medications Ordered in ED Medications  lidocaine HCl (PF) (XYLOCAINE) 2 % injection 5 mL (has no administration in time range)  povidone-iodine  (BETADINE) 10 % external solution (has no administration in time range)  Tdap (BOOSTRIX) injection 0.5 mL (0.5 mLs Intramuscular Given 07/13/23 2006)    ED Course/ Medical Decision Making/ A&P                                 Medical Decision Making Patient here for evaluation of laceration to his left hand.  Accidentally cut while using a razor blade.  Last tetanus unknown.  Neurovascularly intact.  Does not take blood thinners.   Laceration left hand, bleeding controlled.  Neurovascularly intact.  Patient portion of the laceration is superficial and does not need repair. No FB's.  No indication for imaging.    Amount and/or Complexity of Data Reviewed Discussion of management or test interpretation with external provider(s):  Td updated here  Wound cleaned and thoroughly examined through range of motion and depth of the wound.  No foreign body seen.  No injury of the deep structures.  Skin well-approximated.  Bandage applied.  Patient given wound care instructions with sutures out in 8 to 10 days.  Tylenol or ibuprofen if needed for pain.  Risk OTC drugs. Prescription drug management.           Final Clinical Impression(s) / ED Diagnoses Final diagnoses:  Laceration of left hand without foreign body, initial encounter    Rx / DC Orders ED Discharge Orders     None         Rosey Bath 07/13/23 2158    Durwin Glaze, MD 07/13/23 2255

## 2023-07-13 NOTE — ED Triage Notes (Signed)
 Pt with cut left palm with razor blade about 20 min ago, about 1.5 inch in length. Bleeding controlled at present.  Last tetanus shot unknown.

## 2023-07-13 NOTE — ED Notes (Signed)
 Pt has a wound to left lower hand. Not actively bleeding at this time. Pt denies pain. Wound is open and red.
